# Patient Record
Sex: Male | Born: 1970 | Race: White | Hispanic: No | Marital: Married | Smoking: Never smoker
Health system: Southern US, Community
[De-identification: ages and names within clinical notes are randomized; demographics above are authoritative.]

## PROBLEM LIST (undated history)

## (undated) DIAGNOSIS — I341 Nonrheumatic mitral (valve) prolapse: Secondary | ICD-10-CM

## (undated) DIAGNOSIS — Z973 Presence of spectacles and contact lenses: Secondary | ICD-10-CM

## (undated) HISTORY — PX: WISDOM TOOTH EXTRACTION: SHX21

---

## 2014-03-10 ENCOUNTER — Ambulatory Visit: Payer: Self-pay | Admitting: Family Medicine

## 2014-04-28 ENCOUNTER — Encounter: Payer: Self-pay | Admitting: Family Medicine

## 2014-04-28 DIAGNOSIS — E669 Obesity, unspecified: Secondary | ICD-10-CM

## 2014-04-28 DIAGNOSIS — R351 Nocturia: Secondary | ICD-10-CM

## 2014-04-28 DIAGNOSIS — R5382 Chronic fatigue, unspecified: Secondary | ICD-10-CM

## 2014-04-28 DIAGNOSIS — E663 Overweight: Secondary | ICD-10-CM | POA: Insufficient documentation

## 2014-04-28 DIAGNOSIS — R5381 Other malaise: Secondary | ICD-10-CM

## 2014-04-28 DIAGNOSIS — R0683 Snoring: Secondary | ICD-10-CM | POA: Insufficient documentation

## 2014-04-28 DIAGNOSIS — R7303 Prediabetes: Secondary | ICD-10-CM | POA: Insufficient documentation

## 2014-04-28 DIAGNOSIS — K760 Fatty (change of) liver, not elsewhere classified: Secondary | ICD-10-CM | POA: Insufficient documentation

## 2014-04-28 DIAGNOSIS — I341 Nonrheumatic mitral (valve) prolapse: Secondary | ICD-10-CM | POA: Insufficient documentation

## 2014-12-27 ENCOUNTER — Ambulatory Visit (INDEPENDENT_AMBULATORY_CARE_PROVIDER_SITE_OTHER): Payer: BLUE CROSS/BLUE SHIELD | Admitting: Family Medicine

## 2014-12-27 ENCOUNTER — Encounter: Payer: Self-pay | Admitting: Family Medicine

## 2014-12-27 VITALS — BP 112/62 | HR 60 | Ht 72.0 in | Wt 185.0 lb

## 2014-12-27 DIAGNOSIS — R7303 Prediabetes: Secondary | ICD-10-CM | POA: Diagnosis not present

## 2014-12-27 DIAGNOSIS — R221 Localized swelling, mass and lump, neck: Secondary | ICD-10-CM | POA: Diagnosis not present

## 2014-12-27 DIAGNOSIS — K760 Fatty (change of) liver, not elsewhere classified: Secondary | ICD-10-CM | POA: Diagnosis not present

## 2014-12-27 DIAGNOSIS — E663 Overweight: Secondary | ICD-10-CM

## 2014-12-27 NOTE — Progress Notes (Signed)
Date:  12/27/2014   Name:  Anthony Hernandez   DOB:  1970/07/02   MRN:  [id number]  PCP:  No primary care provider on file.    Chief Complaint: Follow-up   History of Present Illness:  This is a 44 y.o. male with non-tender mass R anterior neck for past year, started as pimple and has grown, father with hx lipomas. Has lost 40# in past year, prediabetes improved and LFT's normalized with NAFLD last visit. Had flu imm last month. Snoring, nocturia, and fatigue all resolved with weight loss.  Review of Systems:  Review of Systems  Respiratory: Negative for shortness of breath.   Cardiovascular: Negative for chest pain and leg swelling.  Endocrine: Negative for polyuria.  Genitourinary: Negative for difficulty urinating.  Neurological: Negative for syncope and light-headedness.    Patient Active Problem List   Diagnosis Date Noted  . Overweight (BMI 25.0-29.9) 04/28/2014  . Snoring 04/28/2014  . Chronic fatigue and malaise 04/28/2014  . Pre-diabetes 04/28/2014  . Fatty liver disease, nonalcoholic 04/28/2014  . Mitral valve prolapse 04/28/2014  . Frequent urination at night 04/28/2014    Prior to Admission medications   Not on File    No Known Allergies  History reviewed. No pertinent past surgical history.  Social History  Substance Use Topics  . Smoking status: Never Smoker   . Smokeless tobacco: None  . Alcohol Use: 0.0 oz/week    0 Standard drinks or equivalent per week    Family History  Problem Relation Age of Onset  . Diabetes Mother   . Hypertension Father   . Diabetes Maternal Grandmother   . Hypertension Maternal Grandmother   . Stroke Maternal Grandmother   . Cancer Maternal Grandfather   . Diabetes Paternal Grandmother   . Hypertension Paternal Grandfather     Medication list has been reviewed and updated.  Physical Examination: BP 112/62 mmHg  Pulse 60  Ht 6' (1.829 m)  Wt 185 lb (83.915 kg)  BMI 25.08 kg/m2  Physical Exam  Constitutional:  He appears well-developed and well-nourished.  HENT:  Mobile soft symmetric 2x2 cm mass R anterior neck  Cardiovascular: Normal rate, regular rhythm and normal heart sounds.   Pulmonary/Chest: Effort normal and breath sounds normal.  Musculoskeletal: He exhibits no edema.  Neurological: He is alert.  Skin: Skin is warm and dry.  Psychiatric: He has a normal mood and affect. His behavior is normal.  Nursing note and vitals reviewed.   Assessment and Plan:  1. Mass of right side of neck Suspect lipoma but persistent and enlarging - Ambulatory referral to ENT  2. Pre-diabetes Expect improved with weight loss - HgB A1c  3. Fatty liver disease, nonalcoholic Expect improved with weight loss - Comprehensive Metabolic Panel (CMET)  4. Overweight (BMI 25.0-29.9) Cont exercise/weight loss   Return in about 6 months (around 06/27/2015).  Dionne AnoWilliam M. Kingsley SpittlePlonk, Jr. MD [facility]  12/27/2014

## 2014-12-28 LAB — COMPREHENSIVE METABOLIC PANEL
ALT: 27 IU/L (ref 0–44)
AST: 21 IU/L (ref 0–40)
Albumin/Globulin Ratio: 2.2 (ref 1.1–2.5)
Albumin: 4.4 g/dL (ref 3.5–5.5)
Alkaline Phosphatase: 58 IU/L (ref 39–117)
BILIRUBIN TOTAL: 0.5 mg/dL (ref 0.0–1.2)
BUN/Creatinine Ratio: 15 (ref 9–20)
BUN: 16 mg/dL (ref 6–24)
CHLORIDE: 97 mmol/L (ref 96–106)
CO2: 26 mmol/L (ref 18–29)
Calcium: 9.1 mg/dL (ref 8.7–10.2)
Creatinine, Ser: 1.04 mg/dL (ref 0.76–1.27)
GFR calc non Af Amer: 88 mL/min/{1.73_m2} (ref >59)
GFR, EST AFRICAN AMERICAN: 102 mL/min/{1.73_m2} (ref >59)
GLUCOSE: 87 mg/dL (ref 65–99)
Globulin, Total: 2 g/dL (ref 1.5–4.5)
Potassium: 4 mmol/L (ref 3.5–5.2)
Sodium: 140 mmol/L (ref 134–144)
TOTAL PROTEIN: 6.4 g/dL (ref 6.0–8.5)

## 2014-12-28 LAB — HEMOGLOBIN A1C
ESTIMATED AVERAGE GLUCOSE: 111 mg/dL
HEMOGLOBIN A1C: 5.5 % (ref 4.8–5.6)

## 2015-01-09 ENCOUNTER — Encounter: Payer: Self-pay | Admitting: *Deleted

## 2015-01-10 NOTE — Discharge Instructions (Signed)

## 2015-01-11 ENCOUNTER — Ambulatory Visit
Admission: RE | Admit: 2015-01-11 | Discharge: 2015-01-11 | Disposition: A | Discharge status: Home / Self Care | Payer: BLUE CROSS/BLUE SHIELD | Source: Ambulatory Visit | Attending: Otolaryngology | Admitting: Otolaryngology

## 2015-01-11 ENCOUNTER — Ambulatory Visit: Payer: BLUE CROSS/BLUE SHIELD | Admitting: Anesthesiology

## 2015-01-11 ENCOUNTER — Encounter: Payer: Self-pay | Admitting: Otolaryngology

## 2015-01-11 ENCOUNTER — Encounter
Admission: RE | Disposition: A | Discharge status: Home / Self Care | Payer: Self-pay | Source: Ambulatory Visit | Attending: Otolaryngology

## 2015-01-11 DIAGNOSIS — L720 Epidermal cyst: Secondary | ICD-10-CM | POA: Diagnosis not present

## 2015-01-11 DIAGNOSIS — I341 Nonrheumatic mitral (valve) prolapse: Secondary | ICD-10-CM | POA: Diagnosis not present

## 2015-01-11 DIAGNOSIS — R221 Localized swelling, mass and lump, neck: Secondary | ICD-10-CM | POA: Diagnosis present

## 2015-01-11 HISTORY — DX: Presence of spectacles and contact lenses: Z97.3

## 2015-01-11 HISTORY — PX: EXCISION MASS NECK: SHX6703

## 2015-01-11 HISTORY — DX: Nonrheumatic mitral (valve) prolapse: I34.1

## 2015-01-11 SURGERY — EXCISION, MASS, NECK
Anesthesia: Monitor Anesthesia Care | Wound class: Clean

## 2015-01-11 MED ORDER — LIDOCAINE-EPINEPHRINE 1 %-1:100000 IJ SOLN
INTRAMUSCULAR | Status: DC | PRN
  Administered 2015-01-11: 7 mL

## 2015-01-11 MED ORDER — LIDOCAINE HCL (CARDIAC) 20 MG/ML IV SOLN
INTRAVENOUS | Status: DC | PRN
  Administered 2015-01-11: 40 mg via INTRAVENOUS

## 2015-01-11 MED ORDER — BACITRACIN 500 UNIT/GM EX OINT
TOPICAL_OINTMENT | CUTANEOUS | Status: DC | PRN
  Administered 2015-01-11: 1 via TOPICAL

## 2015-01-11 MED ORDER — HYDROMORPHONE HCL 1 MG/ML IJ SOLN: 0.2500 mg | INTRAMUSCULAR | Status: DC | PRN

## 2015-01-11 MED ORDER — LACTATED RINGERS IV SOLN
INTRAVENOUS | Status: DC
  Administered 2015-01-11: 09:00:00 via INTRAVENOUS

## 2015-01-11 MED ORDER — FENTANYL CITRATE (PF) 100 MCG/2ML IJ SOLN
INTRAMUSCULAR | Status: DC | PRN
  Administered 2015-01-11 (×2): 50 ug via INTRAVENOUS

## 2015-01-11 MED ORDER — OXYCODONE HCL 5 MG PO TABS: 5.0000 mg | ORAL_TABLET | Freq: Once | ORAL | Status: DC | PRN

## 2015-01-11 MED ORDER — PROMETHAZINE HCL 25 MG/ML IJ SOLN: 6.2500 mg | INTRAMUSCULAR | Status: DC | PRN

## 2015-01-11 MED ORDER — MEPERIDINE HCL 25 MG/ML IJ SOLN: 6.2500 mg | INTRAMUSCULAR | Status: DC | PRN

## 2015-01-11 MED ORDER — PROPOFOL 500 MG/50ML IV EMUL
INTRAVENOUS | Status: DC | PRN
  Administered 2015-01-11: 50 ug/kg/min via INTRAVENOUS

## 2015-01-11 MED ORDER — MIDAZOLAM HCL 2 MG/2ML IJ SOLN
INTRAMUSCULAR | Status: DC | PRN
  Administered 2015-01-11: 2 mg via INTRAVENOUS

## 2015-01-11 MED ORDER — OXYCODONE HCL 5 MG/5ML PO SOLN: 5.0000 mg | Freq: Once | ORAL | Status: DC | PRN

## 2015-01-11 SURGICAL SUPPLY — 30 items
BLADE SURG 15 STRL LF DISP TIS (BLADE) IMPLANT
BLADE SURG 15 STRL SS (BLADE)
CORD BIP STRL DISP 12FT (MISCELLANEOUS) ×3 IMPLANT
DRAPE HEAD BAR (DRAPES) ×3 IMPLANT
DRESSING TELFA 4X3 1S ST N-ADH (GAUZE/BANDAGES/DRESSINGS) ×3 IMPLANT
DRSG TEGADERM 4X4.75 (GAUZE/BANDAGES/DRESSINGS) ×3 IMPLANT
ELECT CAUTERY BLADE TIP 2.5 (TIP)
ELECT CAUTERY NEEDLE 2.0 MIC (NEEDLE) ×3 IMPLANT
ELECTRODE CAUTERY BLDE TIP 2.5 (TIP) IMPLANT
GAUZE SPONGE 4X4 12PLY STRL (GAUZE/BANDAGES/DRESSINGS) IMPLANT
GLOVE PI ULTRA LF STRL 7.5 (GLOVE) ×2 IMPLANT
GLOVE PI ULTRA NON LATEX 7.5 (GLOVE) ×4
KIT ROOM TURNOVER OR (KITS) ×3 IMPLANT
LIQUID BAND (GAUZE/BANDAGES/DRESSINGS) IMPLANT
NEEDLE HYPO 25GX1X1/2 BEV (NEEDLE) ×3 IMPLANT
NS IRRIG 500ML POUR BTL (IV SOLUTION) ×3 IMPLANT
PACK DRAPE NASAL/ENT (PACKS) ×3 IMPLANT
PAD GROUND ADULT SPLIT (MISCELLANEOUS) ×3 IMPLANT
PENCIL ELECTRO HAND CTR (MISCELLANEOUS) ×3 IMPLANT
SOL PREP PVP 2OZ (MISCELLANEOUS) ×3
SOLUTION PREP PVP 2OZ (MISCELLANEOUS) ×1 IMPLANT
STRAP BODY AND KNEE 60X3 (MISCELLANEOUS) ×3 IMPLANT
SUCTION FRAZIER TIP 10 FR DISP (SUCTIONS) IMPLANT
SUT ETHILON 6 0 9-3 1X18 BLK (SUTURE) ×3 IMPLANT
SUT PROLENE 5 0 P 3 (SUTURE) ×3 IMPLANT
SUT VIC AB 4-0 RB1 27 (SUTURE)
SUT VIC AB 4-0 RB1 27X BRD (SUTURE) IMPLANT
SUT VIC AB 5-0 P-3 18X BRD (SUTURE) ×1 IMPLANT
SUT VIC AB 5-0 P3 18 (SUTURE) ×2
SYRINGE 10CC LL (SYRINGE) ×3 IMPLANT

## 2015-01-11 NOTE — H&P (Signed)
  H&P has been reviewed and no changes necessary. To be downloaded later. 

## 2015-01-11 NOTE — Anesthesia Procedure Notes (Signed)
Procedure Name: MAC Performed by: Modesto Ganoe Pre-anesthesia Checklist: Patient identified, Emergency Drugs available, Suction available, Timeout performed and Patient being monitored Patient Re-evaluated:Patient Re-evaluated prior to inductionOxygen Delivery Method: Nasal cannula Placement Confirmation: positive ETCO2     

## 2015-01-11 NOTE — Anesthesia Preprocedure Evaluation (Addendum)
Anesthesia Evaluation  Patient identified by MRN, date of birth, ID band Patient awake    Reviewed: Allergy & Precautions, NPO status , Patient's Chart, lab work & pertinent test results, reviewed documented beta blocker date and time   History of Anesthesia Complications Negative for: history of anesthetic complications  Airway Mallampati: II  TM Distance: >3 FB Neck ROM: Full    Dental no notable dental hx.    Pulmonary neg pulmonary ROS,    Pulmonary exam normal        Cardiovascular Normal cardiovascular exam+ Valvular Problems/Murmurs MVP      Neuro/Psych negative neurological ROS  negative psych ROS   GI/Hepatic negative GI ROS, Neg liver ROS,   Endo/Other  negative endocrine ROS  Renal/GU negative Renal ROS     Musculoskeletal   Abdominal   Peds negative pediatric ROS (+)  Hematology negative hematology ROS (+)   Anesthesia Other Findings   Reproductive/Obstetrics                            Anesthesia Physical Anesthesia Plan  ASA: II  Anesthesia Plan: MAC   Post-op Pain Management:    Induction: Intravenous  Airway Management Planned:   Additional Equipment:   Intra-op Plan:   Post-operative Plan:   Informed Consent: I have reviewed the patients History and Physical, chart, labs and discussed the procedure including the risks, benefits and alternatives for the proposed anesthesia with the patient or authorized representative who has indicated his/her understanding and acceptance.     Plan Discussed with: CRNA  Anesthesia Plan Comments:         Anesthesia Quick Evaluation

## 2015-01-11 NOTE — Anesthesia Postprocedure Evaluation (Signed)
Anesthesia Post Note  Patient: Anthony Hernandez  Procedure(s) Performed: Procedure(s) (LRB): EXCISION ANTERIOR MASS NECK (N/A)  Patient location during evaluation: PACU Anesthesia Type: MAC Level of consciousness: awake and alert and oriented Pain management: pain level controlled Vital Signs Assessment: post-procedure vital signs reviewed and stable Respiratory status: spontaneous breathing and nonlabored ventilation Cardiovascular status: stable Postop Assessment: adequate PO intake and no signs of nausea or vomiting Anesthetic complications: no    Harolyn RutherfordJoshua Anabell Swint

## 2015-01-11 NOTE — Op Note (Signed)
01/11/2015  10:28 AM    [name], Anthony Hernandez  [id number]   Pre-Op Dx:  Anterior neck mass  Post-op Dx: Sebaceous cyst of anterior neck skin  Proc: Excision sebaceous cyst of anterior neck skin. Total incision line 2-1/2 cm   Surg:  Aeisha Minarik H  Anes:  GOT  EBL:  Minimal  Comp:  None  Findings:  Large white-yellow cyst underneath the dermis in the subcutaneous plane above the platysma muscle. The cyst was completely excised.  Procedure: Patient was given IV sedation and while he was sleeping local anesthesia was placed in the skin. Approximately 7 mL of 1% Xylocaine with epi 100,000 was used for infiltration around the mass. The skin at the marked previously around the mass to show the size of it. The skin was then prepped and draped in a sterile fashion.  An elliptical incision was created over the skin lesion removed central portion of the skin where it appeared the mass was attached to. The skin was elevated superiorly and inferiorly to free up the mass. As the soft tissues freed from I could see it was a cystic structure of white-yellow creamy fluid. The cyst was not ruptured and was completely freed up all the way around the outside. This was completely removed along with an ellipse of skin overlying. This was sent for permanent section. Bleeding was controlled with bipolar cautery.  The wound was then closed using 50 Vicryls for closing of the dermis. The skin was then closed using a running 6-0 nylon locking suture. Wound was covered with small amount of bacitracin ointment, all of by Telfa and a Tegaderm dressing. The patient tolerated procedure well. He was awakened taken to the recovery room in satisfactory condition. There were no operative complications.  Dispo:   To PACU to be discharged home  Plan:  To follow-up in the office in 1 week for suture removal. He is not shave over this area to prevent removal of sutures to early.  [name]  01/11/2015 10:28 AM

## 2015-01-11 NOTE — Transfer of Care (Signed)
Immediate Anesthesia Transfer of Care Note  Patient: Anthony Hernandez  Procedure(s) Performed: Procedure(s): EXCISION ANTERIOR MASS NECK (N/A)  Patient Location: PACU  Anesthesia Type: MAC  Level of Consciousness: awake, alert  and patient cooperative  Airway and Oxygen Therapy: Patient Spontanous Breathing and Patient connected to supplemental oxygen  Post-op Assessment: Post-op Vital signs reviewed, Patient's Cardiovascular Status Stable, Respiratory Function Stable, Patent Airway and No signs of Nausea or vomiting  Post-op Vital Signs: Reviewed and stable  Complications: No apparent anesthesia complications

## 2015-01-12 ENCOUNTER — Encounter: Payer: Self-pay | Admitting: Otolaryngology

## 2015-01-16 LAB — SURGICAL PATHOLOGY

## 2016-09-20 IMAGING — US US GALLBLADDER-BILIARY (RUQ)
1 series · 14 of 25 positions shown · non-contrast
Comparison: None.

CLINICAL DATA: Elevated liver function enzymes

EXAM:
US ABDOMEN LIMITED - RIGHT UPPER QUADRANT

[Series 1: us gallbladder-biliary (ruq) · 0.21mm/px · 14 of 59 slices shown]
[im 1/59]
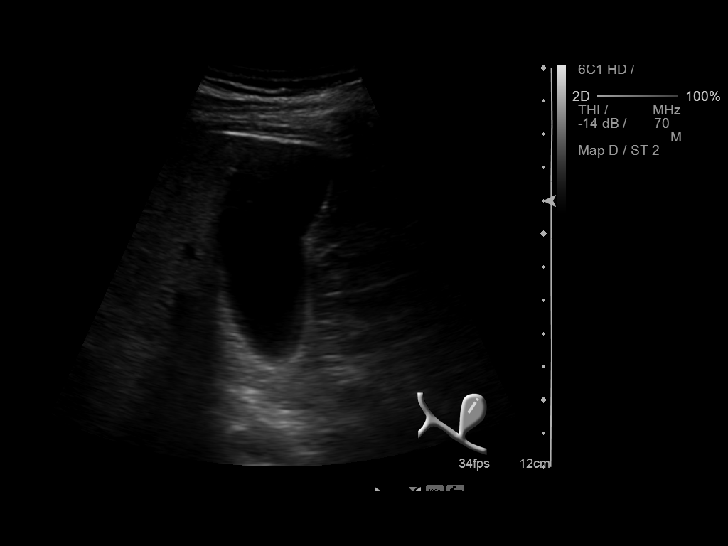
[im 5/59]
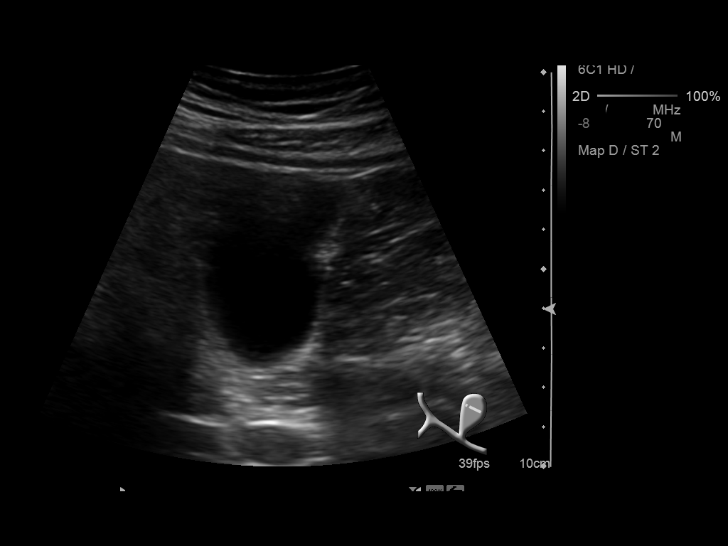
[im 10/59]
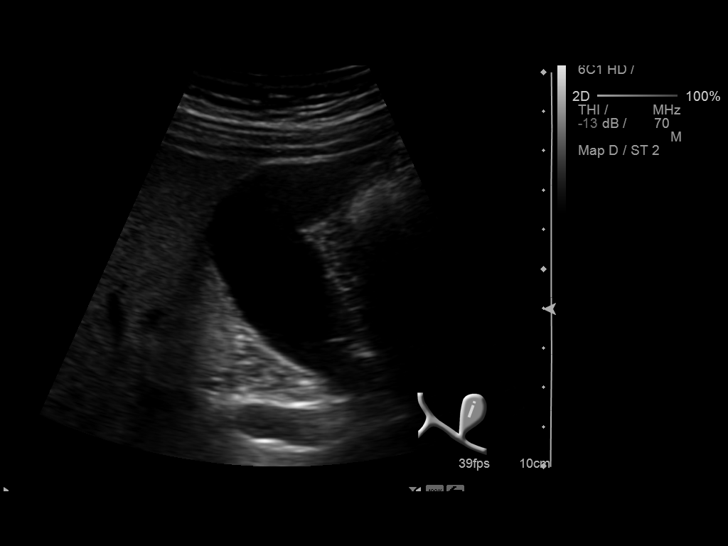
[im 15/59]
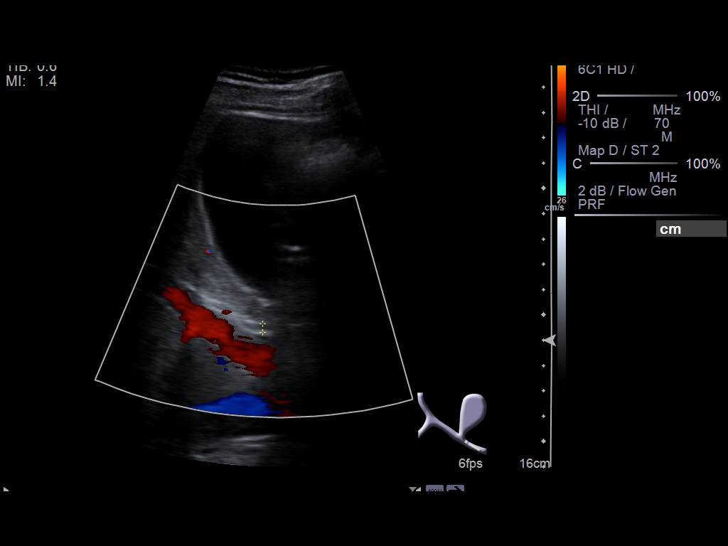
[im 20/59]
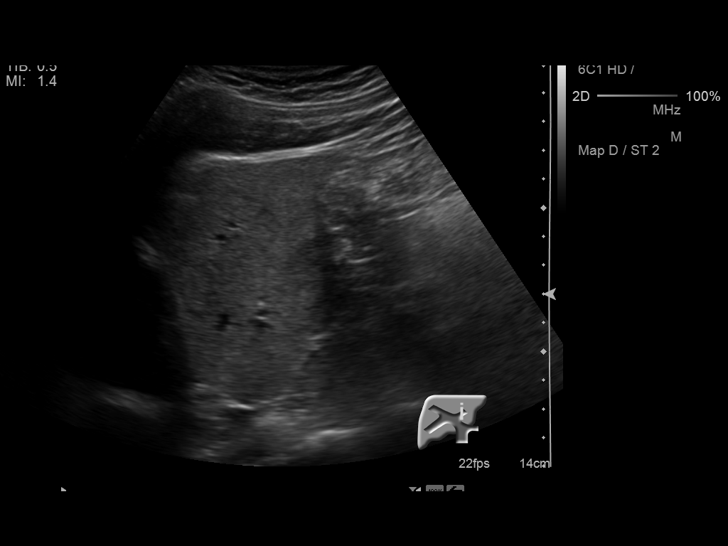
[im 22/59]
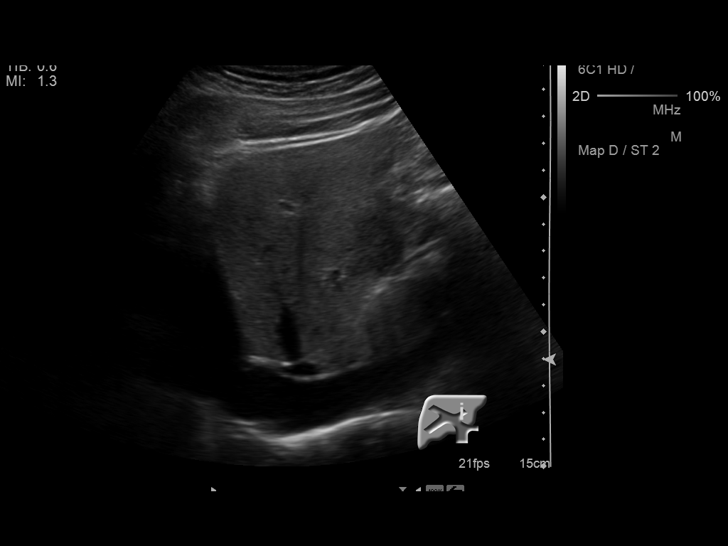
[im 27/59]
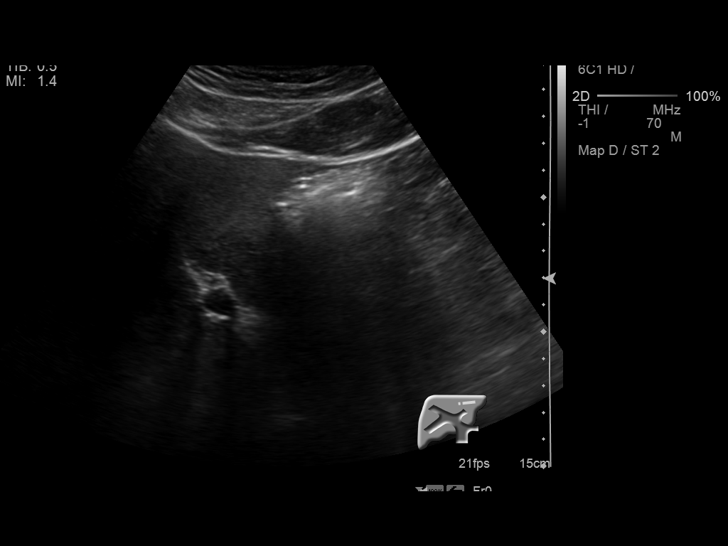
[im 32/59]
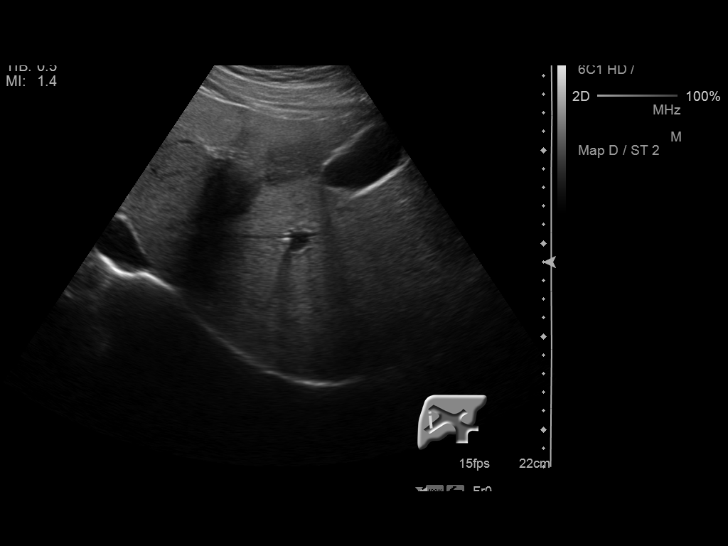
[im 37/59]
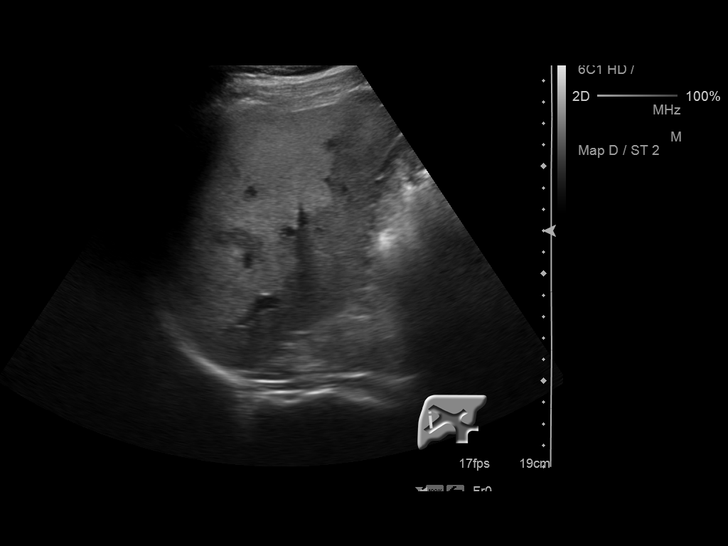
[im 39/59]
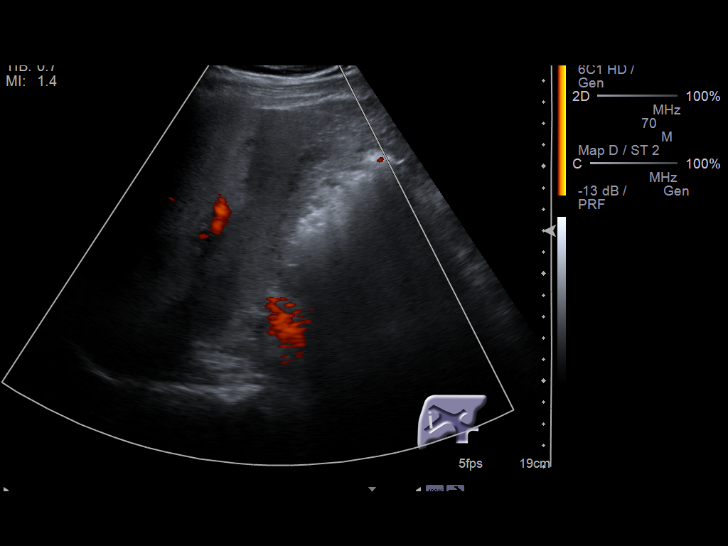
[im 44/59]
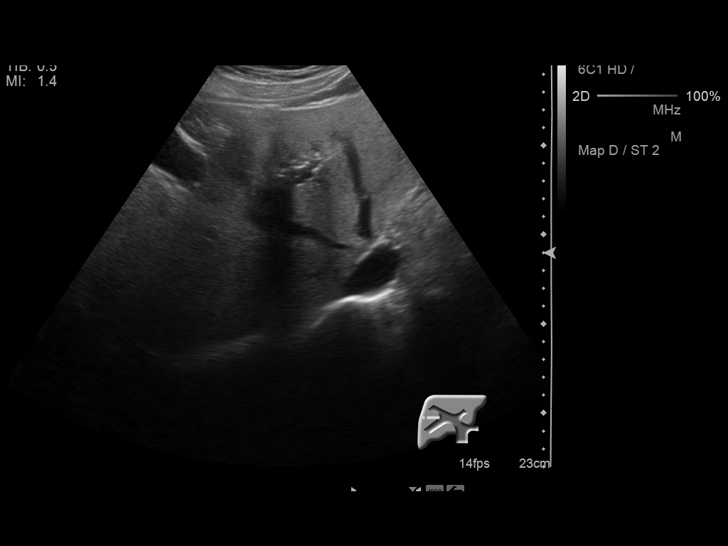
[im 49/59]
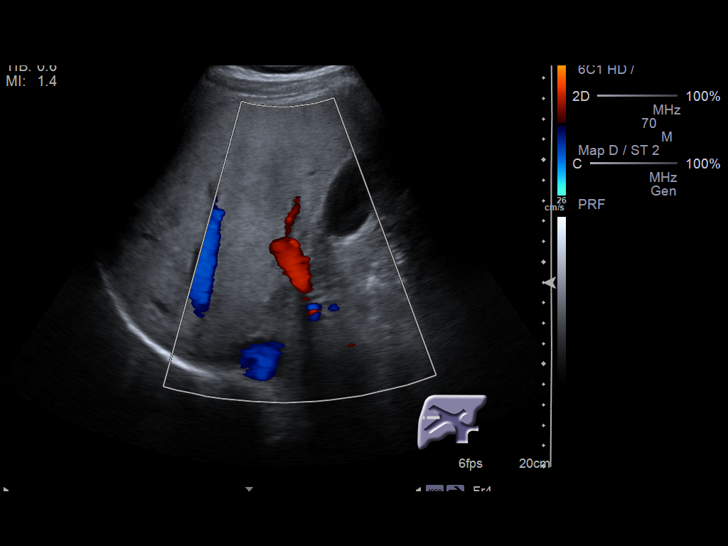
[im 54/59]
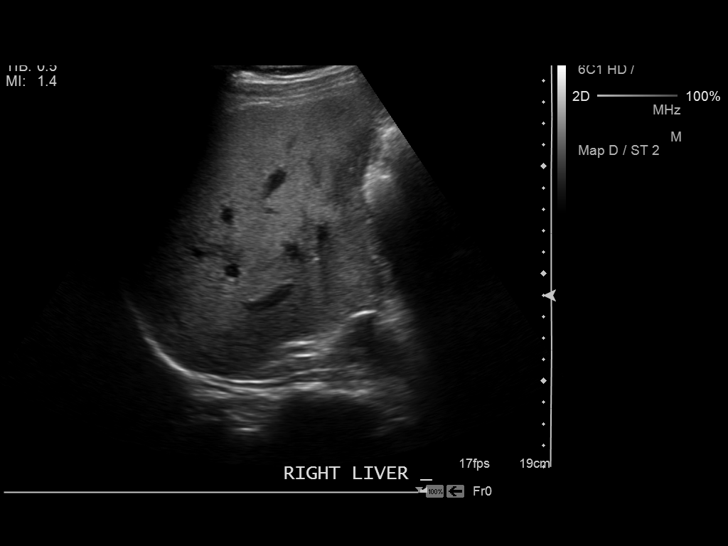
[im 59/59]
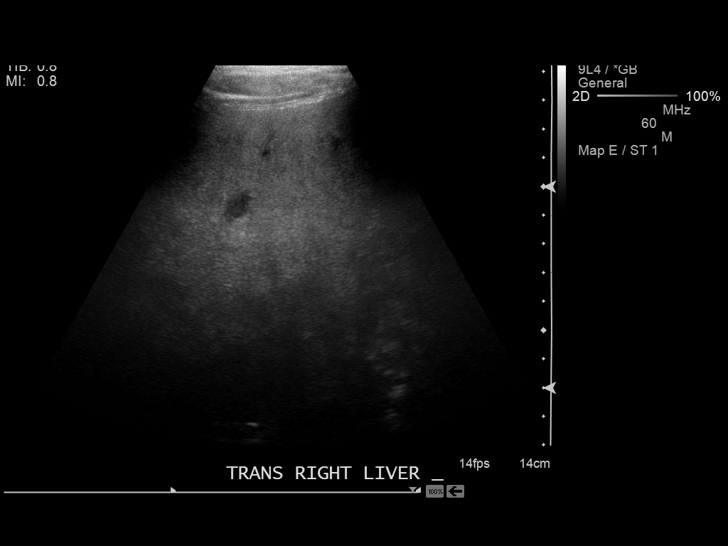

[14 of 25 positions shown; findings below may reference images not displayed]

FINDINGS: Gallbladder:

No gallstones or wall thickening visualized. No sonographic Murphy
sign noted.

Common bile duct:

Diameter: 3.4 mm

Liver:

The hepatic echotexture is increased with areas of header [REDACTED].
There is no focal mass or ductal dilation.
IMPRESSION: Fatty infiltration of the liver within it areas of focal fatty
sparing. No acute hepatobiliary abnormality is demonstrated.

## 2023-03-24 ENCOUNTER — Ambulatory Visit: Payer: Self-pay

## 2023-03-24 DIAGNOSIS — D123 Benign neoplasm of transverse colon: Secondary | ICD-10-CM | POA: Diagnosis not present

## 2023-03-24 DIAGNOSIS — Z1211 Encounter for screening for malignant neoplasm of colon: Secondary | ICD-10-CM | POA: Diagnosis present

## 2023-03-24 DIAGNOSIS — D124 Benign neoplasm of descending colon: Secondary | ICD-10-CM | POA: Diagnosis not present
